# Patient Record
Sex: Female | Born: 1990 | Race: Black or African American | Hispanic: No | Marital: Single | Smoking: Never smoker
Health system: Southern US, Community
[De-identification: ages and names within clinical notes are randomized; demographics above are authoritative.]

## PROBLEM LIST (undated history)

## (undated) ENCOUNTER — Inpatient Hospital Stay (HOSPITAL_COMMUNITY): Payer: Self-pay

## (undated) DIAGNOSIS — J45909 Unspecified asthma, uncomplicated: Secondary | ICD-10-CM

## (undated) HISTORY — PX: NO PAST SURGERIES: SHX2092

---

## 2010-08-11 ENCOUNTER — Emergency Department (HOSPITAL_COMMUNITY)
Admission: EM | Admit: 2010-08-11 | Discharge: 2010-08-11 | Disposition: A | Discharge status: Home / Self Care | Payer: No Typology Code available for payment source | Attending: Emergency Medicine | Admitting: Emergency Medicine

## 2010-08-11 DIAGNOSIS — S5010XA Contusion of unspecified forearm, initial encounter: Secondary | ICD-10-CM | POA: Insufficient documentation

## 2010-08-11 DIAGNOSIS — M25559 Pain in unspecified hip: Secondary | ICD-10-CM | POA: Insufficient documentation

## 2010-08-11 DIAGNOSIS — S7000XA Contusion of unspecified hip, initial encounter: Secondary | ICD-10-CM | POA: Insufficient documentation

## 2010-08-11 DIAGNOSIS — S0003XA Contusion of scalp, initial encounter: Secondary | ICD-10-CM | POA: Insufficient documentation

## 2010-08-11 DIAGNOSIS — M79609 Pain in unspecified limb: Secondary | ICD-10-CM | POA: Insufficient documentation

## 2011-03-10 ENCOUNTER — Emergency Department (HOSPITAL_COMMUNITY)
Admission: EM | Admit: 2011-03-10 | Discharge: 2011-03-11 | Disposition: A | Discharge status: Home / Self Care | Payer: No Typology Code available for payment source | Attending: Emergency Medicine | Admitting: Emergency Medicine

## 2011-03-10 DIAGNOSIS — M79609 Pain in unspecified limb: Secondary | ICD-10-CM | POA: Insufficient documentation

## 2011-03-10 DIAGNOSIS — M542 Cervicalgia: Secondary | ICD-10-CM | POA: Insufficient documentation

## 2011-03-10 DIAGNOSIS — S335XXA Sprain of ligaments of lumbar spine, initial encounter: Secondary | ICD-10-CM | POA: Insufficient documentation

## 2011-03-10 DIAGNOSIS — Y92410 Unspecified street and highway as the place of occurrence of the external cause: Secondary | ICD-10-CM | POA: Insufficient documentation

## 2011-03-10 DIAGNOSIS — R51 Headache: Secondary | ICD-10-CM | POA: Insufficient documentation

## 2011-03-11 ENCOUNTER — Emergency Department (HOSPITAL_COMMUNITY): Payer: No Typology Code available for payment source

## 2011-12-28 ENCOUNTER — Encounter (HOSPITAL_COMMUNITY): Payer: Self-pay | Admitting: *Deleted

## 2011-12-28 ENCOUNTER — Inpatient Hospital Stay (HOSPITAL_COMMUNITY)
Admission: AD | Admit: 2011-12-28 | Discharge: 2011-12-28 | Disposition: A | Discharge status: Home / Self Care | Payer: Medicaid Other | Source: Ambulatory Visit | Attending: Family Medicine | Admitting: Family Medicine

## 2011-12-28 DIAGNOSIS — O219 Vomiting of pregnancy, unspecified: Secondary | ICD-10-CM

## 2011-12-28 DIAGNOSIS — O210 Mild hyperemesis gravidarum: Secondary | ICD-10-CM | POA: Insufficient documentation

## 2011-12-28 HISTORY — DX: Unspecified asthma, uncomplicated: J45.909

## 2011-12-28 LAB — POCT PREGNANCY, URINE: Preg Test, Ur: POSITIVE — AB

## 2011-12-28 MED ORDER — ONDANSETRON 8 MG PO TBDP
8.0000 mg | ORAL_TABLET | Freq: Once | ORAL | Status: AC
  Administered 2011-12-28: 8 mg via ORAL
  Filled 2011-12-28: qty 1

## 2011-12-28 MED ORDER — ONDANSETRON 8 MG PO TBDP: 8.0000 mg | ORAL_TABLET | Freq: Three times a day (TID) | ORAL | Status: AC | PRN

## 2011-12-28 NOTE — MAU Provider Note (Signed)
  History     CSN: [id number]  Arrival date and time: 12/28/11 5366   First Provider Initiated Contact with Patient 12/28/11 1040      Chief Complaint  Patient presents with  . Possible Pregnancy  . Emesis   HPI Sheryl Bruce is a 21 y.o. female @ [redacted]w[redacted]d gestation who presents to MAU for nausea in early pregnancy. LMP 11/15/11. Last pap smear 2 years ago and was normal. No birth control. Current sex partner x 5 years. Hx Chlamydia. Denies vaginal bleeding or pain. The history was provided by the patient.  OB History    Grav Para Term Preterm Abortions TAB SAB Ect Mult Living   1               Past Medical History  Diagnosis Date  . Asthma     Past Surgical History  Procedure Date  . No past surgeries     Family History  Problem Relation Age of Onset  . Other Neg Hx     History  Substance Use Topics  . Smoking status: Never Smoker   . Smokeless tobacco: Never Used  . Alcohol Use: No    Allergies: No Known Allergies  No prescriptions prior to admission    Review of Systems  Constitutional: Negative for fever, chills and weight loss.  HENT: Negative for ear pain, nosebleeds, congestion, sore throat and neck pain.   Eyes: Negative for blurred vision, double vision, photophobia and pain.  Respiratory: Negative for cough, shortness of breath and wheezing.   Cardiovascular: Negative for chest pain, palpitations and leg swelling.  Gastrointestinal: Positive for nausea, vomiting and diarrhea. Negative for heartburn, abdominal pain and constipation.  Genitourinary: Positive for frequency. Negative for dysuria and urgency.  Musculoskeletal: Negative for myalgias and back pain.  Skin: Negative for itching and rash.  Neurological: Negative for dizziness, sensory change, speech change, seizures, weakness and headaches.  Endo/Heme/Allergies: Does not bruise/bleed easily.  Psychiatric/Behavioral: Negative for depression. The patient is not nervous/anxious and does not have  insomnia.    Blood pressure 120/72, pulse 91, temperature 98.6 F (37 C), temperature source Oral, resp. rate 20, height 5\' 1"  (1.549 m), weight 126 lb (57.153 kg), last menstrual period 11/15/2011.  Physical Exam  Constitutional: She is oriented to person, place, and time. She appears well-developed and well-nourished. No distress.  HENT:  Head: Normocephalic and atraumatic.  Eyes: EOM are normal.  Neck: Neck supple.  Cardiovascular: Normal rate.   Respiratory: Effort normal.  GI: Soft. There is no tenderness.  Musculoskeletal: Normal range of motion.  Neurological: She is alert and oriented to person, place, and time.  Skin: Skin is warm and dry.  Psychiatric: She has a normal mood and affect. Her behavior is normal. Judgment and thought content normal.   Results for orders placed during the hospital encounter of 12/28/11 (from the past 24 hour(s))  POCT PREGNANCY, URINE     Status: Abnormal   Collection Time   12/28/11 10:39 AM      Component Value Range   Preg Test, Ur POSITIVE (*) NEGATIVE    Assessment: Nausea in first trimester pregnancy  Plan:  Zofran Rx   Start prenatal care. Procedures  Tane Biegler, RN, FNP, [facility] 12/28/2011, 10:45 AM

## 2011-12-28 NOTE — MAU Provider Note (Signed)
Chart reviewed and agree with management and plan.  

## 2011-12-28 NOTE — MAU Note (Signed)
Took a home test a few days ago, both were positive.  Since then has been feeling sick.  Thrown up once today.

## 2012-01-04 ENCOUNTER — Other Ambulatory Visit: Payer: No Typology Code available for payment source

## 2012-02-10 LAB — OB RESULTS CONSOLE ABO/RH: RH Type: POSITIVE

## 2012-02-10 LAB — OB RESULTS CONSOLE HEPATITIS B SURFACE ANTIGEN: Hepatitis B Surface Ag: NEGATIVE

## 2012-02-10 LAB — OB RESULTS CONSOLE ANTIBODY SCREEN: Antibody Screen: NEGATIVE

## 2012-02-10 LAB — OB RESULTS CONSOLE PLATELET COUNT: Platelets: 273 10*3/uL

## 2012-02-12 LAB — OB RESULTS CONSOLE GC/CHLAMYDIA: Gonorrhea: NEGATIVE

## 2012-05-07 ENCOUNTER — Encounter (HOSPITAL_COMMUNITY): Payer: Self-pay

## 2012-05-07 ENCOUNTER — Inpatient Hospital Stay (HOSPITAL_COMMUNITY)
Admission: AD | Admit: 2012-05-07 | Discharge: 2012-05-07 | Disposition: A | Discharge status: Home / Self Care | Payer: Medicaid Other | Source: Ambulatory Visit | Attending: Obstetrics and Gynecology | Admitting: Obstetrics and Gynecology

## 2012-05-07 DIAGNOSIS — O368190 Decreased fetal movements, unspecified trimester, not applicable or unspecified: Secondary | ICD-10-CM | POA: Insufficient documentation

## 2012-05-07 DIAGNOSIS — Y93E1 Activity, personal bathing and showering: Secondary | ICD-10-CM | POA: Insufficient documentation

## 2012-05-07 DIAGNOSIS — W010XXA Fall on same level from slipping, tripping and stumbling without subsequent striking against object, initial encounter: Secondary | ICD-10-CM | POA: Insufficient documentation

## 2012-05-07 NOTE — MAU Note (Signed)
Patient states she fell backwards in the shower and hit her left arm, shoulder and left side of buttocks yesterday at 1700. States she has felt less movement last night, no movement today. Denies leaking or bleeding, no abdominal pain.

## 2012-05-07 NOTE — MAU Provider Note (Signed)
  History     CSN: [id number]  Arrival date and time: 05/07/12 1410   First Provider Initiated Contact with Patient 05/07/12 1445      Chief Complaint  Patient presents with  . Fall  . Decreased Fetal Movement   HPIDasha Bruce is 21 y.o. G1P0 [redacted]w[redacted]d weeks presenting with reports of a fall yesterday at 5pm,   Patient of Dr. Dareen Piano.  Spoke to on call doctor last night, told to rest and call back if sxs worsened.   Slipped in the shower, hitting her left side.   Denies vaginal bleeding but did had abdominal pain last night.  None today.  Came in today for decreased fetal movement.   Has felt fetal movement while in MAU.  Has not taken anything for soreness.     Past Medical History  Diagnosis Date  . Asthma     Past Surgical History  Procedure Date  . No past surgeries     Family History  Problem Relation Age of Onset  . Other Neg Hx     History  Substance Use Topics  . Smoking status: Never Smoker   . Smokeless tobacco: Never Used  . Alcohol Use: No    Allergies: No Known Allergies  Prescriptions prior to admission  Medication Sig Dispense Refill  . Prenatal Vit-Fe Fumarate-FA (PRENATAL MULTIVITAMIN) TABS Take 1 tablet by mouth daily.        Review of Systems  Constitutional: Negative.   HENT: Negative.   Respiratory: Negative.   Cardiovascular: Negative.   Gastrointestinal: Negative for abdominal pain.  Genitourinary:       Neg for vaginal bleeding or leaking of fluid.  Decreased fetal movement.   Physical Exam   Blood pressure 107/60, pulse 102, temperature 98.5 F (36.9 C), temperature source Oral, resp. rate 16, height 5\' 2"  (1.575 Bruce), weight 154 lb 9.6 oz (70.126 kg), last menstrual period 11/15/2011, SpO2 100.00%.  Physical Exam  Constitutional: She is oriented to person, place, and time. She appears well-developed and well-nourished. No distress.  HENT:  Head: Normocephalic.  Neck: Normal range of motion.  Cardiovascular: Normal rate.    Respiratory: Effort normal.  GI: Soft. She exhibits no distension and no mass. There is no tenderness. There is no rebound and no guarding.  Genitourinary: Uterus is enlarged. Uterus is not tender. No bleeding around the vagina. No vaginal discharge found.  Neurological: She is alert and oriented to person, place, and time.  Skin: Skin is warm and dry.  Psychiatric: She has a normal mood and affect.    MAU Course  Procedures  FMR reactive baseline 145.  Uterine without contractions  MDM 14:55  Reported MSE to Dr. Tenny Craw.  Order given to evaluate patient and if + fetal movement, may discharge.  Per Dr. Tenny Craw, do not need 4 hour eval--accident happened yesterday afternoon.  Assessment and Plan  A:  Decreased fetal movement      Hx of fall yesterday afternoon    Negative for vaginal bleeding or loss of fluid per exam  P:  Instructed to call her doctor for abdominal pain/contractions, loss of fluid, or vaginal bleeding      Keep scheduled appointment with Dr. Dareen Piano     Tylenol for soreness Sheryl Bruce,Sheryl Bruce 05/07/2012, 2:46 PM

## 2012-06-01 NOTE — L&D Delivery Note (Signed)
Vaginal Delivery Note At 8:34 PM a viable and healthy female was delivered via Vaginal, Spontaneous Delivery.  Presentation: vertex; Position: Right,, Occiput,, Anterior;   Delivery of the head: 08/12/2012  8:32 PM  Second maneuver: McRoberts ,   Third maneuver: Suprapubic,   Fourth maneuver: Woods Screw,    Delivery of body: 08/12/2012 8:34 PM   APGAR: 6, 9; weight 8 lb 8 oz (3855 g).   Placenta status: Intact, Spontaneous.   Cord: 2 vessels with the following complications: None.  Cord pH: Pending  The patient pushed infants head to the level of the nose but could not delivery the infants mouth and chin due to exhausted.  The perineum was manually reduced.  Immediately it was recognized that the shoulders were not going to deliver easily and the 2 RNs in attendance placed the patient in McRoberts position.  This did not budge the anterior shoulder.  The patient was repositioned and suprapubic was initiated.  Additional assistance was called for and upon arrival the patient was repositioned, suprapubic was reapplied and woods screw was attempted and the anterior shoulder then delivered followed by the posterior shoulder. The infant was placed on the maternal abdomen, cord was clamped and cut, and the infant was then transferred to the isolette.  The placenta was delivered spontaneously, intact, with 3V cord.  A cord gas was collected. A 1st degree perineal laceration was repaired with a single interrupted 3-0 vicryl stitch. Mother and baby are doing well after delivery.  The baby has facial bruising around the left eye but is moving all extremities.  Anesthesia: Epidural  Episiotomy: None Lacerations: 1st degree vaginal Suture Repair: 3.0 vicryl Est. Blood Loss (mL): 400 cc  Mom to postpartum.  Baby to nursery-stable.  ROSS,KENDRA H. 08/12/2012, 9:03 PM

## 2012-08-05 ENCOUNTER — Encounter (HOSPITAL_COMMUNITY): Payer: Self-pay | Admitting: *Deleted

## 2012-08-05 ENCOUNTER — Inpatient Hospital Stay (HOSPITAL_COMMUNITY)
Admission: AD | Admit: 2012-08-05 | Discharge: 2012-08-05 | Disposition: A | Discharge status: Home / Self Care | Payer: Medicaid Other | Source: Ambulatory Visit | Attending: Obstetrics and Gynecology | Admitting: Obstetrics and Gynecology

## 2012-08-05 DIAGNOSIS — O479 False labor, unspecified: Secondary | ICD-10-CM | POA: Insufficient documentation

## 2012-08-05 NOTE — MAU Note (Signed)
Pt states she has been having contractions since 0100 that have gotten harder and harder

## 2012-08-09 ENCOUNTER — Inpatient Hospital Stay (HOSPITAL_COMMUNITY)
Admission: AD | Admit: 2012-08-09 | Discharge: 2012-08-09 | Disposition: A | Discharge status: Home / Self Care | Payer: Medicaid Other | Source: Ambulatory Visit | Attending: Obstetrics & Gynecology | Admitting: Obstetrics & Gynecology

## 2012-08-09 ENCOUNTER — Encounter (HOSPITAL_COMMUNITY): Payer: Self-pay

## 2012-08-09 DIAGNOSIS — O479 False labor, unspecified: Secondary | ICD-10-CM | POA: Insufficient documentation

## 2012-08-09 NOTE — MAU Note (Signed)
Contractions every 5 minutes for the last few hours. Denies leaking of fluid or vaginal bleeding. Positive fetal movement. Was dilated 3 cm in office by Dr. Arlyce Dice.

## 2012-08-11 ENCOUNTER — Encounter (HOSPITAL_COMMUNITY): Payer: Self-pay | Admitting: *Deleted

## 2012-08-11 ENCOUNTER — Inpatient Hospital Stay (HOSPITAL_COMMUNITY)
Admission: AD | Admit: 2012-08-11 | Discharge: 2012-08-14 | DRG: 775 | Disposition: A | Discharge status: Home / Self Care | Payer: Medicaid Other | Source: Ambulatory Visit | Attending: Obstetrics and Gynecology | Admitting: Obstetrics and Gynecology

## 2012-08-11 ENCOUNTER — Inpatient Hospital Stay (HOSPITAL_COMMUNITY)
Admission: AD | Admit: 2012-08-11 | Discharge: 2012-08-11 | Disposition: A | Discharge status: Home / Self Care | Payer: Medicaid Other | Source: Ambulatory Visit | Attending: Obstetrics and Gynecology | Admitting: Obstetrics and Gynecology

## 2012-08-11 DIAGNOSIS — O479 False labor, unspecified: Secondary | ICD-10-CM | POA: Insufficient documentation

## 2012-08-11 NOTE — MAU Note (Signed)
uc's since 0230, no bleeding or LOF, active FM.

## 2012-08-11 NOTE — MAU Note (Signed)
Pt states fher contractions are closer and harder

## 2012-08-12 ENCOUNTER — Encounter (HOSPITAL_COMMUNITY): Payer: Self-pay | Admitting: Anesthesiology

## 2012-08-12 ENCOUNTER — Encounter (HOSPITAL_COMMUNITY): Payer: Self-pay | Admitting: *Deleted

## 2012-08-12 ENCOUNTER — Inpatient Hospital Stay (HOSPITAL_COMMUNITY): Payer: Medicaid Other | Admitting: Anesthesiology

## 2012-08-12 LAB — CBC
HCT: 36.3 % (ref 36.0–46.0)
HCT: 33.1 % — ABNORMAL LOW (ref 36.0–46.0)
Hemoglobin: 12.7 g/dL (ref 12.0–15.0)
Hemoglobin: 11.6 g/dL — ABNORMAL LOW (ref 12.0–15.0)
MCH: 33.1 pg (ref 26.0–34.0)
MCH: 33.2 pg (ref 26.0–34.0)
MCHC: 35 g/dL (ref 30.0–36.0)
MCV: 94.5 fL (ref 78.0–100.0)
MCV: 94.8 fL (ref 78.0–100.0)
RBC: 3.84 MIL/uL — ABNORMAL LOW (ref 3.87–5.11)
RBC: 3.49 MIL/uL — ABNORMAL LOW (ref 3.87–5.11)

## 2012-08-12 LAB — TYPE AND SCREEN: ABO/RH(D): A POS

## 2012-08-12 MED ORDER — OXYTOCIN 40 UNITS IN LACTATED RINGERS INFUSION - SIMPLE MED
1.0000 m[IU]/min | INTRAVENOUS | Status: DC
  Administered 2012-08-12: 2 m[IU]/min via INTRAVENOUS
  Filled 2012-08-12: qty 1000

## 2012-08-12 MED ORDER — TERBUTALINE SULFATE 1 MG/ML IJ SOLN: 0.2500 mg | Freq: Once | INTRAMUSCULAR | Status: DC | PRN

## 2012-08-12 MED ORDER — DIPHENHYDRAMINE HCL 50 MG/ML IJ SOLN: 12.5000 mg | INTRAMUSCULAR | Status: DC | PRN

## 2012-08-12 MED ORDER — OXYCODONE-ACETAMINOPHEN 5-325 MG PO TABS: 1.0000 | ORAL_TABLET | ORAL | Status: DC | PRN

## 2012-08-12 MED ORDER — LIDOCAINE HCL (PF) 1 % IJ SOLN
30.0000 mL | INTRAMUSCULAR | Status: DC | PRN
  Administered 2012-08-12: 30 mL via SUBCUTANEOUS
  Filled 2012-08-12 (×2): qty 30

## 2012-08-12 MED ORDER — OXYTOCIN 40 UNITS IN LACTATED RINGERS INFUSION - SIMPLE MED: 62.5000 mL/h | INTRAVENOUS | Status: DC

## 2012-08-12 MED ORDER — OXYTOCIN BOLUS FROM INFUSION: 500.0000 mL | INTRAVENOUS | Status: DC

## 2012-08-12 MED ORDER — EPHEDRINE 5 MG/ML INJ
10.0000 mg | INTRAVENOUS | Status: DC | PRN
  Administered 2012-08-12: 10 mg via INTRAVENOUS

## 2012-08-12 MED ORDER — LIDOCAINE HCL (PF) 1 % IJ SOLN
INTRAMUSCULAR | Status: DC | PRN
  Administered 2012-08-12 (×2): 5 mL

## 2012-08-12 MED ORDER — METHYLERGONOVINE MALEATE 0.2 MG/ML IJ SOLN
INTRAMUSCULAR | Status: AC
  Filled 2012-08-12: qty 1

## 2012-08-12 MED ORDER — ONDANSETRON HCL 4 MG/2ML IJ SOLN
4.0000 mg | Freq: Four times a day (QID) | INTRAMUSCULAR | Status: DC | PRN
  Administered 2012-08-12: 4 mg via INTRAVENOUS
  Filled 2012-08-12: qty 2

## 2012-08-12 MED ORDER — ACETAMINOPHEN 325 MG PO TABS: 650.0000 mg | ORAL_TABLET | ORAL | Status: DC | PRN

## 2012-08-12 MED ORDER — IBUPROFEN 600 MG PO TABS
600.0000 mg | ORAL_TABLET | Freq: Four times a day (QID) | ORAL | Status: DC | PRN
  Administered 2012-08-12: 600 mg via ORAL
  Filled 2012-08-12: qty 1

## 2012-08-12 MED ORDER — EPHEDRINE 5 MG/ML INJ
10.0000 mg | INTRAVENOUS | Status: DC | PRN
  Filled 2012-08-12: qty 4

## 2012-08-12 MED ORDER — LACTATED RINGERS IV SOLN
INTRAVENOUS | Status: DC
  Administered 2012-08-12: 15:00:00 via INTRAVENOUS
  Administered 2012-08-12: 125 mL/h via INTRAVENOUS

## 2012-08-12 MED ORDER — LACTATED RINGERS IV SOLN: 500.0000 mL | Freq: Once | INTRAVENOUS | Status: DC

## 2012-08-12 MED ORDER — FENTANYL 2.5 MCG/ML BUPIVACAINE 1/10 % EPIDURAL INFUSION (WH - ANES)
14.0000 mL/h | INTRAMUSCULAR | Status: DC | PRN
  Administered 2012-08-12 (×2): 14 mL/h via EPIDURAL
  Filled 2012-08-12 (×2): qty 125

## 2012-08-12 MED ORDER — PHENYLEPHRINE 40 MCG/ML (10ML) SYRINGE FOR IV PUSH (FOR BLOOD PRESSURE SUPPORT)
80.0000 ug | PREFILLED_SYRINGE | INTRAVENOUS | Status: DC | PRN
  Filled 2012-08-12: qty 5

## 2012-08-12 MED ORDER — FLEET ENEMA 7-19 GM/118ML RE ENEM: 1.0000 | ENEMA | RECTAL | Status: DC | PRN

## 2012-08-12 MED ORDER — LACTATED RINGERS IV BOLUS (SEPSIS)
500.0000 mL | Freq: Once | INTRAVENOUS | Status: AC
  Administered 2012-08-12: 500 mL via INTRAVENOUS

## 2012-08-12 MED ORDER — PHENYLEPHRINE 40 MCG/ML (10ML) SYRINGE FOR IV PUSH (FOR BLOOD PRESSURE SUPPORT): 80.0000 ug | PREFILLED_SYRINGE | INTRAVENOUS | Status: DC | PRN

## 2012-08-12 MED ORDER — METHYLERGONOVINE MALEATE 0.2 MG/ML IJ SOLN
0.2000 mg | Freq: Once | INTRAMUSCULAR | Status: AC
  Administered 2012-08-12: 0.2 mg via INTRAMUSCULAR

## 2012-08-12 MED ORDER — LACTATED RINGERS IV SOLN: 500.0000 mL | INTRAVENOUS | Status: DC | PRN

## 2012-08-12 MED ORDER — CITRIC ACID-SODIUM CITRATE 334-500 MG/5ML PO SOLN: 30.0000 mL | ORAL | Status: DC | PRN

## 2012-08-12 NOTE — Progress Notes (Signed)
MD on the phone and notified of FHR, UC pattern, SVE, SROM, and platelet count. Orders received will continue to monitor.

## 2012-08-12 NOTE — MAU Note (Signed)
PT SAYS SHE  WAS HERE TODAY - HOME AT 11AM- WAS 4 CM.   FEELS UC ARE STRONGER.  HAS  MUCUS,  NO BLEEDING.    DENIES HSV AND MRSA.

## 2012-08-12 NOTE — Anesthesia Procedure Notes (Signed)
Epidural Patient location during procedure: OB Start time: 08/12/2012 3:54 AM  Staffing Anesthesiologist: Angus Seller., Harrell Gave. Performed by: anesthesiologist   Preanesthetic Checklist Completed: patient identified, site marked, surgical consent, pre-op evaluation, timeout performed, IV checked, risks and benefits discussed and monitors and equipment checked  Epidural Patient position: sitting Prep: site prepped and draped and DuraPrep Patient monitoring: continuous pulse ox and blood pressure Approach: midline Injection technique: LOR air and LOR saline  Needle:  Needle type: Tuohy  Needle gauge: 17 G Needle length: 9 cm and 9 Needle insertion depth: 5 cm cm Catheter type: closed end flexible Catheter size: 19 Gauge Catheter at skin depth: 10 cm Test dose: negative  Assessment Events: blood not aspirated, injection not painful, no injection resistance, negative IV test and no paresthesia  Additional Notes Patient identified.  Risk benefits discussed including failed block, incomplete pain control, headache, nerve damage, paralysis, blood pressure changes, nausea, vomiting, reactions to medication both toxic or allergic, and postpartum back pain.  Patient expressed understanding and wished to proceed.  All questions were answered.  Sterile technique used throughout procedure and epidural site dressed with sterile barrier dressing. No paresthesia or other complications noted.The patient did not experience any signs of intravascular injection such as tinnitus or metallic taste in mouth nor signs of intrathecal spread such as rapid motor block. Please see nursing notes for vital signs.

## 2012-08-12 NOTE — Anesthesia Preprocedure Evaluation (Signed)
Anesthesia Evaluation  Patient identified by MRN, date of birth, ID band Patient awake    Reviewed: Allergy & Precautions, H&P , Patient's Chart, lab work & pertinent test results  Airway Mallampati: II TM Distance: >3 FB Neck ROM: full    Dental no notable dental hx.    Pulmonary neg pulmonary ROS, asthma ,  breath sounds clear to auscultation  Pulmonary exam normal       Cardiovascular negative cardio ROS  Rhythm:regular Rate:Normal     Neuro/Psych negative neurological ROS  negative psych ROS   GI/Hepatic negative GI ROS, Neg liver ROS,   Endo/Other  negative endocrine ROS  Renal/GU negative Renal ROS     Musculoskeletal   Abdominal   Peds  Hematology negative hematology ROS (+)   Anesthesia Other Findings Platelets 93K  Reproductive/Obstetrics (+) Pregnancy                           Anesthesia Physical Anesthesia Plan  ASA: III  Anesthesia Plan: Epidural   Post-op Pain Management:    Induction:   Airway Management Planned:   Additional Equipment:   Intra-op Plan:   Post-operative Plan:   Informed Consent: I have reviewed the patients History and Physical, chart, labs and discussed the procedure including the risks, benefits and alternatives for the proposed anesthesia with the patient or authorized representative who has indicated his/her understanding and acceptance.     Plan Discussed with:   Anesthesia Plan Comments:         Anesthesia Quick Evaluation

## 2012-08-13 LAB — CBC
HCT: 28.4 % — ABNORMAL LOW (ref 36.0–46.0)
MCH: 33 pg (ref 26.0–34.0)
MCV: 95.6 fL (ref 78.0–100.0)
Platelets: 89 10*3/uL — ABNORMAL LOW (ref 150–400)
RDW: 13 % (ref 11.5–15.5)
WBC: 16.6 10*3/uL — ABNORMAL HIGH (ref 4.0–10.5)

## 2012-08-13 MED ORDER — DIPHENHYDRAMINE HCL 25 MG PO CAPS: 25.0000 mg | ORAL_CAPSULE | Freq: Four times a day (QID) | ORAL | Status: DC | PRN

## 2012-08-13 MED ORDER — PRENATAL MULTIVITAMIN CH
1.0000 | ORAL_TABLET | Freq: Every day | ORAL | Status: DC
  Administered 2012-08-13 – 2012-08-14 (×2): 1 via ORAL
  Filled 2012-08-13 (×2): qty 1

## 2012-08-13 MED ORDER — LANOLIN HYDROUS EX OINT: TOPICAL_OINTMENT | CUTANEOUS | Status: DC | PRN

## 2012-08-13 MED ORDER — TETANUS-DIPHTH-ACELL PERTUSSIS 5-2.5-18.5 LF-MCG/0.5 IM SUSP: 0.5000 mL | Freq: Once | INTRAMUSCULAR | Status: DC

## 2012-08-13 MED ORDER — IBUPROFEN 600 MG PO TABS
600.0000 mg | ORAL_TABLET | Freq: Four times a day (QID) | ORAL | Status: DC
  Administered 2012-08-13 – 2012-08-14 (×3): 600 mg via ORAL
  Filled 2012-08-13 (×3): qty 1

## 2012-08-13 MED ORDER — METHYLERGONOVINE MALEATE 0.2 MG PO TABS: 0.2000 mg | ORAL_TABLET | ORAL | Status: DC | PRN

## 2012-08-13 MED ORDER — METHYLERGONOVINE MALEATE 0.2 MG/ML IJ SOLN: 0.2000 mg | INTRAMUSCULAR | Status: DC | PRN

## 2012-08-13 MED ORDER — DIBUCAINE 1 % RE OINT: 1.0000 "application " | TOPICAL_OINTMENT | RECTAL | Status: DC | PRN

## 2012-08-13 MED ORDER — SIMETHICONE 80 MG PO CHEW: 80.0000 mg | CHEWABLE_TABLET | ORAL | Status: DC | PRN

## 2012-08-13 MED ORDER — ONDANSETRON HCL 4 MG PO TABS: 4.0000 mg | ORAL_TABLET | ORAL | Status: DC | PRN

## 2012-08-13 MED ORDER — WITCH HAZEL-GLYCERIN EX PADS
1.0000 "application " | MEDICATED_PAD | CUTANEOUS | Status: DC | PRN
  Administered 2012-08-13: 1 via TOPICAL

## 2012-08-13 MED ORDER — BENZOCAINE-MENTHOL 20-0.5 % EX AERO
1.0000 "application " | INHALATION_SPRAY | CUTANEOUS | Status: DC | PRN
  Filled 2012-08-13: qty 56

## 2012-08-13 MED ORDER — ONDANSETRON HCL 4 MG/2ML IJ SOLN: 4.0000 mg | INTRAMUSCULAR | Status: DC | PRN

## 2012-08-13 MED ORDER — OXYCODONE-ACETAMINOPHEN 5-325 MG PO TABS
1.0000 | ORAL_TABLET | ORAL | Status: DC | PRN
  Administered 2012-08-13 – 2012-08-14 (×4): 1 via ORAL
  Filled 2012-08-13 (×4): qty 1

## 2012-08-13 MED ORDER — SENNOSIDES-DOCUSATE SODIUM 8.6-50 MG PO TABS
2.0000 | ORAL_TABLET | Freq: Every day | ORAL | Status: DC
  Administered 2012-08-13: 2 via ORAL

## 2012-08-13 MED ORDER — ZOLPIDEM TARTRATE 5 MG PO TABS: 5.0000 mg | ORAL_TABLET | Freq: Every evening | ORAL | Status: DC | PRN

## 2012-08-13 NOTE — Anesthesia Postprocedure Evaluation (Signed)
Anesthesia Post Note  Patient: Sheryl Bruce  Procedure(s) Performed: * No procedures listed *  Anesthesia type: Epidural  Patient location: Mother/Baby  Post pain: Pain level controlled  Post assessment: Post-op Vital signs reviewed  Last Vitals:  Filed Vitals:   08/13/12 0500  BP: 101/68  Pulse: 99  Temp: 36.6 C  Resp: 18    Post vital signs: Reviewed  Level of consciousness:alert  Complications: No apparent anesthesia complications

## 2012-08-13 NOTE — Progress Notes (Signed)
Post Partum Day 1 Subjective: no complaints, up ad lib, voiding and tolerating PO  Objective: Filed Vitals:   08/12/12 2331 08/13/12 0030 08/13/12 0134 08/13/12 0500  BP: 107/66 112/65 97/56 101/68  Pulse: 100 99 101 99  Temp: 98 F (36.7 C) 98 F (36.7 C) 98.1 F (36.7 C) 97.9 F (36.6 C)  TempSrc: Oral Oral  Oral  Resp: 14 18 16 18   Height:      Weight:      SpO2:        Physical Exam:  General: alert, cooperative and appears stated age Lochia: appropriate Uterine Fundus: firm   Recent Labs  08/12/12 0255 08/12/12 2205  HGB 12.7 11.6*  HCT 36.3 33.1*    Assessment/Plan: Routine PP care   LOS: 2 days   Sheryl Bruce H. 08/13/2012, 7:22 AM

## 2012-08-14 MED ORDER — IBUPROFEN 600 MG PO TABS: 600.0000 mg | ORAL_TABLET | Freq: Four times a day (QID) | ORAL | Status: DC | PRN

## 2012-08-14 MED ORDER — DOCUSATE SODIUM 100 MG PO CAPS: 100.0000 mg | ORAL_CAPSULE | Freq: Two times a day (BID) | ORAL | Status: DC

## 2012-08-14 MED ORDER — OXYCODONE-ACETAMINOPHEN 5-325 MG PO TABS: 2.0000 | ORAL_TABLET | ORAL | Status: DC | PRN

## 2012-08-14 NOTE — Discharge Summary (Signed)
Obstetric Discharge Summary Reason for Admission: onset of labor Prenatal Procedures: ultrasound Intrapartum Procedures: spontaneous vaginal delivery with shoulder dystocia Postpartum Procedures: none Complications-Operative and Postpartum: none Hemoglobin  Date Value Range Status  08/13/2012 9.8* 12.0 - 15.0 g/dL Final     HCT  Date Value Range Status  08/13/2012 28.4* 36.0 - 46.0 % Final    Physical Exam:  General: alert, cooperative and appears stated age 22: appropriate Uterine Fundus: firm  Discharge Diagnoses: Term Pregnancy-delivered and 2 minute shoulder dystocia  Discharge Information: Date: 08/14/2012 Activity: pelvic rest Diet: routine Medications: Ibuprofen, Colace and Percocet Condition: improved Instructions: refer to practice specific booklet Discharge to: home Follow-up Information   Follow up with Almon Hercules., MD In 4 weeks. (For a post partum evaluation)    Contact information:   [address] [place] [address] [phone]       Newborn Data: Live born female  Birth Weight: 8 lb 8 oz (3855 g) APGAR: 6, 9  Home with mother.  Kjersti Dittmer H. 08/14/2012, 10:02 AM

## 2012-08-15 NOTE — Progress Notes (Signed)
Post discharge chart review completed.  

## 2012-08-19 NOTE — H&P (Signed)
Pt is a 22 yr old female, G1P0 at term who presents to L&D complaining of labor. PNC was uncomplicated. PMHx:see [place] PE: VSSAF        ABD-gravid, palp contractions IMP/ IUP at term in labor PLAN/ admit
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
## 2013-02-09 ENCOUNTER — Ambulatory Visit (INDEPENDENT_AMBULATORY_CARE_PROVIDER_SITE_OTHER): Payer: BC Managed Care – PPO | Admitting: *Deleted

## 2013-02-09 DIAGNOSIS — Z23 Encounter for immunization: Secondary | ICD-10-CM

## 2013-12-20 ENCOUNTER — Ambulatory Visit (INDEPENDENT_AMBULATORY_CARE_PROVIDER_SITE_OTHER): Payer: Managed Care, Other (non HMO) | Admitting: Family Medicine

## 2013-12-20 VITALS — BP 110/60 | HR 70 | Temp 98.2°F | Ht 61.25 in | Wt 119.0 lb

## 2013-12-20 DIAGNOSIS — N898 Other specified noninflammatory disorders of vagina: Secondary | ICD-10-CM

## 2013-12-20 DIAGNOSIS — A499 Bacterial infection, unspecified: Secondary | ICD-10-CM

## 2013-12-20 DIAGNOSIS — B9689 Other specified bacterial agents as the cause of diseases classified elsewhere: Secondary | ICD-10-CM

## 2013-12-20 DIAGNOSIS — N760 Acute vaginitis: Secondary | ICD-10-CM

## 2013-12-20 LAB — POCT WET PREP WITH KOH
KOH PREP POC: NEGATIVE
TRICHOMONAS UA: NEGATIVE
YEAST WET PREP PER HPF POC: NEGATIVE

## 2013-12-20 LAB — POCT URINE PREGNANCY: PREG TEST UR: NEGATIVE

## 2013-12-20 MED ORDER — METRONIDAZOLE 500 MG PO TABS: 500.0000 mg | ORAL_TABLET | Freq: Two times a day (BID) | ORAL | Status: DC

## 2013-12-20 NOTE — Progress Notes (Signed)
   Subjective:    Patient ID: Sheryl Bruce, female    DOB: 1991-01-23, 23 y.o.   MRN: [id number]  HPI 3 days of vaginal discharge thick and white, fishy odor, no itching no burn, no dysuria, no vaginal bleeding issues. Sexual partner 2 current and 3 total. Patient report new sexual partner in the past 6 months. Some pelvic cramping.   Review of Systems No fever, no bodyache,  chills,  No nausea, no vomiting, no diarrhea    Objective:   Physical Exam Vitals reviewed.  Constitutional: He is oriented to person, place, and time. He appears well-developed and well-nourished.  Head: Normocephalic.  Eyes: EOM are normal. No scleral icterus.  Neck: Normal range of motion.  Pulmonary/Chest: Effort normal.  Vaginal exam: Patient normal-appearing vaginal canal with no rectocele or cystocele. She has evidence of a thin white vaginal discharge vaginal canal. Cervix appears normal with no signs of ulcerations or erythema. Bimanual exam did not reveal any cervical motion tenderness, no ovarian enlargement.  Musculoskeletal: Normal range of motion.      Assessment & Plan:  Patient presents today complaining of date white vaginal discharge with a fishy odor. Bimanual exam confirmed a similar discharge the patient's prescription. A wet prep and KOH were collected as well as urine pregnancy test. Workup revealed evidence of bacterial vaginosis with clue cells and 2+ bacteria. No signs of bony knees. Pregnancy test was negative. Patient will be treated with metronidazole 500 mg twice a day for the next 7 days. Patient advised to complete prescription. Any further symptoms continue despite treatment metabolic therapy to follow up in office. Offer further STD testing for gonorrhea chlamydia, STD and syphilis given that the patient reported a new sexual partner patient reports that she refused any further STD testing today to have this done by her GYN

## 2013-12-21 ENCOUNTER — Other Ambulatory Visit: Payer: Self-pay | Admitting: Sports Medicine

## 2013-12-21 DIAGNOSIS — B9689 Other specified bacterial agents as the cause of diseases classified elsewhere: Secondary | ICD-10-CM

## 2013-12-21 DIAGNOSIS — N760 Acute vaginitis: Principal | ICD-10-CM

## 2013-12-21 MED ORDER — METRONIDAZOLE 500 MG PO TABS: 500.0000 mg | ORAL_TABLET | Freq: Two times a day (BID) | ORAL | Status: AC

## 2013-12-22 ENCOUNTER — Encounter: Payer: Self-pay | Admitting: Family Medicine

## 2013-12-22 NOTE — Progress Notes (Signed)
Patient discussed with Dr. Tammy Soursidiano. Agree with assessment and plan of care per her note, with some inadvertent transcribed phrases from dictation software.  Agree with metronidazole 500mg  BID for next 7 days.

## 2014-04-02 ENCOUNTER — Encounter: Payer: Self-pay | Admitting: Family Medicine
